# Patient Record
Sex: Male | Born: 1970 | Race: Black or African American | Hispanic: No | Smoking: Never smoker
Health system: Southern US, Community
[De-identification: ages and names within clinical notes are randomized; demographics above are authoritative.]

## PROBLEM LIST (undated history)

## (undated) HISTORY — PX: OTHER SURGICAL HISTORY: SHX169

---

## 1998-08-02 ENCOUNTER — Emergency Department (HOSPITAL_COMMUNITY): Admission: EM | Admit: 1998-08-02 | Discharge: 1998-08-02 | Payer: Self-pay | Admitting: Emergency Medicine

## 2005-02-24 ENCOUNTER — Emergency Department (HOSPITAL_COMMUNITY): Admission: EM | Admit: 2005-02-24 | Discharge: 2005-02-24 | Payer: Self-pay | Admitting: Family Medicine

## 2006-02-16 ENCOUNTER — Emergency Department (HOSPITAL_COMMUNITY): Admission: EM | Admit: 2006-02-16 | Discharge: 2006-02-16 | Payer: Self-pay | Admitting: Emergency Medicine

## 2007-04-26 ENCOUNTER — Encounter: Admission: RE | Admit: 2007-04-26 | Discharge: 2007-04-26 | Payer: Self-pay | Admitting: Occupational Medicine

## 2009-05-01 ENCOUNTER — Emergency Department (HOSPITAL_COMMUNITY): Admission: EM | Admit: 2009-05-01 | Discharge: 2009-05-01 | Payer: Self-pay | Admitting: Emergency Medicine

## 2011-03-07 LAB — HSV 2 ANTIBODY, IGG: HSV 2 Glycoprotein G Ab, IgG: 0.15 IV

## 2011-03-07 LAB — HSV 1 ANTIBODY, IGG: HSV 1 Glycoprotein G Ab, IgG: 47.5 IV — ABNORMAL HIGH

## 2013-12-01 ENCOUNTER — Encounter (HOSPITAL_COMMUNITY): Payer: Self-pay | Admitting: Emergency Medicine

## 2013-12-01 ENCOUNTER — Emergency Department (HOSPITAL_COMMUNITY)
Admission: EM | Admit: 2013-12-01 | Discharge: 2013-12-01 | Disposition: A | Discharge status: Home / Self Care | Payer: BC Managed Care – PPO | Attending: Emergency Medicine | Admitting: Emergency Medicine

## 2013-12-01 DIAGNOSIS — J069 Acute upper respiratory infection, unspecified: Secondary | ICD-10-CM | POA: Insufficient documentation

## 2013-12-01 DIAGNOSIS — IMO0001 Reserved for inherently not codable concepts without codable children: Secondary | ICD-10-CM | POA: Insufficient documentation

## 2013-12-01 DIAGNOSIS — R52 Pain, unspecified: Secondary | ICD-10-CM | POA: Insufficient documentation

## 2013-12-01 MED ORDER — HYDROCOD POLST-CHLORPHEN POLST 10-8 MG/5ML PO LQCR: 5.0000 mL | Freq: Every evening | ORAL | Status: DC | PRN

## 2013-12-01 MED ORDER — ALBUTEROL SULFATE HFA 108 (90 BASE) MCG/ACT IN AERS: 1.0000 | INHALATION_SPRAY | Freq: Four times a day (QID) | RESPIRATORY_TRACT | Status: DC | PRN

## 2013-12-01 NOTE — ED Notes (Signed)
Pt states he has had cough, sore throat and runny nose for past 3 days. NO n/v/d.

## 2013-12-01 NOTE — ED Provider Notes (Signed)
CSN: [id number]     Arrival date & time 12/01/13  1442 History  This chart was scribed for non-physician practitioner, Elpidio AnisShari Lorrain Rivers, PA-C working with Rolland PorterMark James, MD by Greggory StallionKayla Andersen, ED scribe. This patient was seen in room WTR7/WTR7 and the patient's care was started at 4:24 PM.   Chief Complaint  Patient presents with  . Sore Throat  . Cough   The history is provided by the patient. No language interpreter was used.   HPI Comments: Judith PartGregory T Drudge is a 43 y.o. male who presents to the Emergency Department complaining of subjective fever, generalized body aches, productive cough, sore throat and rhinorrhea that started 5 days ago. He has taken a sinus decongestant with little relief. Denies history of asthma. Denies history of smoking cigarettes.   History reviewed. No pertinent past medical history. History reviewed. No pertinent past surgical history. History reviewed. No pertinent family history. History  Substance Use Topics  . Smoking status: Never Smoker   . Smokeless tobacco: Not on file  . Alcohol Use: No    Review of Systems  Constitutional: Positive for fever.  HENT: Positive for rhinorrhea and sore throat.   Respiratory: Positive for cough.   Musculoskeletal: Positive for myalgias.  All other systems reviewed and are negative.    Allergies  Review of patient's allergies indicates no known allergies.  Home Medications   Current Outpatient Rx  Name  Route  Sig  Dispense  Refill  . OVER THE COUNTER MEDICATION   Oral   Take 2 tablets by mouth every 6 (six) hours as needed (cold symptoms). Over the counter sinus decongestion and pain          BP 121/86  Pulse 75  Temp(Src) 98.6 F (37 C) (Oral)  Resp 20  SpO2 98%  Physical Exam  Nursing note and vitals reviewed. Constitutional: He is oriented to person, place, and time. He appears well-developed and well-nourished. No distress.  HENT:  Head: Normocephalic and atraumatic.  Right Ear: Tympanic membrane  and ear canal normal.  Left Ear: Tympanic membrane and ear canal normal.  Nose: No mucosal edema.  Mouth/Throat: Posterior oropharyngeal erythema present. No oropharyngeal exudate.  Eyes: EOM are normal.  Neck: Neck supple. No tracheal deviation present.  Cardiovascular: Normal rate, regular rhythm and normal heart sounds.   Pulmonary/Chest: Effort normal and breath sounds normal. No respiratory distress. He has no wheezes. He has no rhonchi. He has no rales.  Musculoskeletal: Normal range of motion.  Neurological: He is alert and oriented to person, place, and time.  Skin: Skin is warm and dry.  Psychiatric: He has a normal mood and affect. His behavior is normal.    ED Course  Procedures (including critical care time)  DIAGNOSTIC STUDIES: Oxygen Saturation is 98% on RA, normal by my interpretation.    COORDINATION OF CARE: 4:26 PM-Discussed treatment plan which includes cough medication with pt at bedside and pt agreed to plan.   Labs Review Labs Reviewed - No data to display Imaging Review No results found.  EKG Interpretation   None       MDM  No diagnosis found. 1. URI  Well appearing patient with URI symptoms. Symptomatic treatment.   I personally performed the services described in this documentation, which was scribed in my presence. The recorded information has been reviewed and is accurate.    Arnoldo HookerShari A Bach Rocchi, PA-C 12/01/13 1639

## 2013-12-01 NOTE — Discharge Instructions (Signed)
Antibiotic Nonuse  Your caregiver felt that the infection or problem was not one that would be helped with an antibiotic. Infections may be caused by viruses or bacteria. Only a caregiver can tell which one of these is the likely cause of an illness. A cold is the most common cause of infection in both adults and children. A cold is a virus. Antibiotic treatment will have no effect on a viral infection. Viruses can lead to many lost days of work caring for sick children and many missed days of school. Children may catch as many as 10 "colds" or "flus" per year during which they can be tearful, cranky, and uncomfortable. The goal of treating a virus is aimed at keeping the ill person comfortable. Antibiotics are medications used to help the body fight bacterial infections. There are relatively few types of bacteria that cause infections but there are hundreds of viruses. While both viruses and bacteria cause infection they are very different types of germs. A viral infection will typically go away by itself within 7 to 10 days. Bacterial infections may spread or get worse without antibiotic treatment. Examples of bacterial infections are:  Sore throats (like strep throat or tonsillitis).  Infection in the lung (pneumonia).  Ear and skin infections. Examples of viral infections are:  Colds or flus.  Most coughs and bronchitis.  Sore throats not caused by Strep.  Runny noses. It is often best not to take an antibiotic when a viral infection is the cause of the problem. Antibiotics can kill off the helpful bacteria that we have inside our body and allow harmful bacteria to start growing. Antibiotics can cause side effects such as allergies, nausea, and diarrhea without helping to improve the symptoms of the viral infection. Additionally, repeated uses of antibiotics can cause bacteria inside of our body to become resistant. That resistance can be passed onto harmful bacterial. The next time you have  an infection it may be harder to treat if antibiotics are used when they are not needed. Not treating with antibiotics allows our own immune system to develop and take care of infections more efficiently. Also, antibiotics will work better for [place] when they are prescribed for bacterial infections. Treatments for a child that is ill may include:  Give extra fluids throughout the day to stay hydrated.  Get plenty of rest.  Only give your child over-the-counter or prescription medicines for pain, discomfort, or fever as directed by your caregiver.  The use of a cool mist humidifier may help stuffy noses.  Cold medications if suggested by your caregiver. Your caregiver may decide to start you on an antibiotic if:  The problem you were seen for today continues for a longer length of time than expected.  You develop a secondary bacterial infection. SEEK MEDICAL CARE IF:  Fever lasts longer than 5 days.  Symptoms continue to get worse after 5 to 7 days or become severe.  Difficulty in breathing develops.  Signs of dehydration develop (poor drinking, rare urinating, dark colored urine).  Changes in behavior or worsening tiredness (listlessness or lethargy). Document Released: 01/23/2002 Document Revised: 02/06/2012 Document Reviewed: 07/22/2009 [facility] Patient Information 2014 [place], [place].  Cough, Adult  A cough is a reflex that helps clear your throat and airways. It can help heal the body or may be a reaction to an irritated airway. A cough may only last 2 or 3 weeks (acute) or may last more than 8 weeks (chronic).  CAUSES Acute cough:  Viral or  bacterial infections. °Chronic cough: °· Infections. °· Allergies. °· Asthma. °· Post-nasal drip. °· Smoking. °· Heartburn or acid reflux. °· Some medicines. °· Chronic lung problems (COPD). °· Cancer. °SYMPTOMS  °· Cough. °· Fever. °· Chest pain. °· Increased breathing rate. °· High-pitched whistling sound when breathing  (wheezing). °· Colored mucus that you cough up (sputum). °TREATMENT  °· A bacterial cough may be treated with antibiotic medicine. °· A viral cough must run its course and will not respond to antibiotics. °· Your caregiver may recommend other treatments if you have a chronic cough. °HOME CARE INSTRUCTIONS  °· Only take over-the-counter or prescription medicines for pain, discomfort, or fever as directed by your caregiver. Use cough suppressants only as directed by your caregiver. °· Use a cold steam vaporizer or humidifier in your bedroom or home to help loosen secretions. °· Sleep in a semi-upright position if your cough is worse at night. °· Rest as needed. °· Stop smoking if you smoke. °SEEK IMMEDIATE MEDICAL CARE IF:  °· You have pus in your sputum. °· Your cough starts to worsen. °· You cannot control your cough with suppressants and are losing sleep. °· You begin coughing up blood. °· You have difficulty breathing. °· You develop pain which is getting worse or is uncontrolled with medicine. °· You have a fever. °MAKE SURE YOU:  °· Understand these instructions. °· Will watch your condition. °· Will get help right away if you are not doing well or get worse. °Document Released: 05/13/2011 Document Revised: 02/06/2012 Document Reviewed: 05/13/2011 °Exit[employer]® Patient Information ©2014 [employer]. °Upper Respiratory Infection, Adult °An upper respiratory infection (URI) is also known as the common cold. It is often caused by a type of germ (virus). Colds are easily spread (contagious). You can pass it to others by kissing, coughing, sneezing, or drinking out of the same glass. Usually, you get better in 1 or 2 weeks.  °HOME CARE  °· Only take medicine as told by your doctor. °· Use a warm mist humidifier or breathe in steam from a hot shower. °· Drink enough water and fluids to keep your pee (urine) clear or pale yellow. °· Get plenty of rest. °· Return to work when your temperature is back to normal or as told  by your doctor. You may use a face mask and wash your hands to stop your cold from spreading. °GET HELP RIGHT AWAY IF:  °· After the first few days, you feel you are getting worse. °· You have questions about your medicine. °· You have chills, shortness of breath, or brown or red spit (mucus). °· You have yellow or brown snot (nasal discharge) or pain in the face, especially when you bend forward. °· You have a fever, puffy (swollen) neck, pain when you swallow, or white spots in the back of your throat. °· You have a bad headache, ear pain, sinus pain, or chest pain. °· You have a high-pitched whistling sound when you breathe in and out (wheezing). °· You have a lasting cough or cough up blood. °· You have sore muscles or a stiff neck. °MAKE SURE YOU:  °· Understand these instructions. °· Will watch your condition. °· Will get help right away if you are not doing well or get worse. °Document Released: 05/02/2008 Document Revised: 02/06/2012 Document Reviewed: 03/21/2011 °ExitCare® Patient Information ©2014 [employer]. ° °

## 2013-12-11 NOTE — ED Provider Notes (Signed)
Medical screening examination/treatment/procedure(s) were performed by non-physician practitioner and as supervising physician I was immediately available for consultation/collaboration.  EKG Interpretation   None         Zan Triska, MD 12/11/13 0015 

## 2014-02-06 ENCOUNTER — Other Ambulatory Visit: Payer: Self-pay | Admitting: Orthopaedic Surgery

## 2014-02-06 DIAGNOSIS — S29011A Strain of muscle and tendon of front wall of thorax, initial encounter: Secondary | ICD-10-CM

## 2014-02-15 ENCOUNTER — Ambulatory Visit
Admission: RE | Admit: 2014-02-15 | Discharge: 2014-02-15 | Disposition: A | Discharge status: Home / Self Care | Payer: BC Managed Care – PPO | Source: Ambulatory Visit | Attending: Orthopaedic Surgery | Admitting: Orthopaedic Surgery

## 2014-02-15 DIAGNOSIS — S29011A Strain of muscle and tendon of front wall of thorax, initial encounter: Secondary | ICD-10-CM

## 2014-04-15 ENCOUNTER — Ambulatory Visit: Payer: BC Managed Care – PPO | Attending: Orthopaedic Surgery

## 2014-04-15 DIAGNOSIS — IMO0001 Reserved for inherently not codable concepts without codable children: Secondary | ICD-10-CM | POA: Insufficient documentation

## 2014-04-15 DIAGNOSIS — M25519 Pain in unspecified shoulder: Secondary | ICD-10-CM | POA: Insufficient documentation

## 2014-04-30 ENCOUNTER — Ambulatory Visit: Payer: BC Managed Care – PPO | Attending: Orthopaedic Surgery | Admitting: Rehabilitation

## 2014-04-30 DIAGNOSIS — IMO0001 Reserved for inherently not codable concepts without codable children: Secondary | ICD-10-CM | POA: Insufficient documentation

## 2014-04-30 DIAGNOSIS — M25519 Pain in unspecified shoulder: Secondary | ICD-10-CM | POA: Insufficient documentation

## 2014-05-01 ENCOUNTER — Ambulatory Visit: Payer: BC Managed Care – PPO | Admitting: Physical Therapy

## 2014-05-01 DIAGNOSIS — IMO0001 Reserved for inherently not codable concepts without codable children: Secondary | ICD-10-CM | POA: Diagnosis not present

## 2014-05-07 ENCOUNTER — Ambulatory Visit: Payer: BC Managed Care – PPO | Admitting: Rehabilitation

## 2014-05-07 DIAGNOSIS — IMO0001 Reserved for inherently not codable concepts without codable children: Secondary | ICD-10-CM | POA: Diagnosis not present

## 2014-05-08 ENCOUNTER — Ambulatory Visit: Payer: BC Managed Care – PPO | Admitting: Rehabilitation

## 2014-05-08 DIAGNOSIS — IMO0001 Reserved for inherently not codable concepts without codable children: Secondary | ICD-10-CM | POA: Diagnosis not present

## 2014-05-14 ENCOUNTER — Ambulatory Visit: Payer: BC Managed Care – PPO | Admitting: Rehabilitation

## 2014-05-14 DIAGNOSIS — IMO0001 Reserved for inherently not codable concepts without codable children: Secondary | ICD-10-CM | POA: Diagnosis not present

## 2014-05-21 ENCOUNTER — Ambulatory Visit: Payer: BC Managed Care – PPO | Admitting: Rehabilitation

## 2014-05-21 DIAGNOSIS — IMO0001 Reserved for inherently not codable concepts without codable children: Secondary | ICD-10-CM | POA: Diagnosis not present

## 2014-05-22 ENCOUNTER — Ambulatory Visit: Payer: BC Managed Care – PPO | Admitting: Physical Therapy

## 2014-05-22 DIAGNOSIS — IMO0001 Reserved for inherently not codable concepts without codable children: Secondary | ICD-10-CM | POA: Diagnosis not present

## 2014-05-28 ENCOUNTER — Encounter: Payer: BC Managed Care – PPO | Admitting: Rehabilitation

## 2014-05-29 ENCOUNTER — Encounter: Payer: BC Managed Care – PPO | Admitting: Rehabilitation

## 2014-06-04 ENCOUNTER — Ambulatory Visit: Payer: BC Managed Care – PPO | Attending: Orthopaedic Surgery | Admitting: Rehabilitation

## 2014-06-04 DIAGNOSIS — IMO0001 Reserved for inherently not codable concepts without codable children: Secondary | ICD-10-CM | POA: Insufficient documentation

## 2014-06-04 DIAGNOSIS — M25519 Pain in unspecified shoulder: Secondary | ICD-10-CM | POA: Insufficient documentation

## 2014-06-05 ENCOUNTER — Ambulatory Visit: Payer: BC Managed Care – PPO | Admitting: Physical Therapy

## 2014-06-05 DIAGNOSIS — IMO0001 Reserved for inherently not codable concepts without codable children: Secondary | ICD-10-CM | POA: Diagnosis present

## 2014-06-05 DIAGNOSIS — M25519 Pain in unspecified shoulder: Secondary | ICD-10-CM | POA: Diagnosis not present

## 2014-06-11 ENCOUNTER — Ambulatory Visit: Payer: BC Managed Care – PPO | Admitting: Rehabilitation

## 2014-06-17 ENCOUNTER — Ambulatory Visit: Payer: BC Managed Care – PPO | Admitting: Rehabilitation

## 2014-06-17 DIAGNOSIS — IMO0001 Reserved for inherently not codable concepts without codable children: Secondary | ICD-10-CM | POA: Diagnosis not present

## 2014-06-25 ENCOUNTER — Encounter: Payer: BC Managed Care – PPO | Admitting: Rehabilitation

## 2014-07-02 ENCOUNTER — Ambulatory Visit: Payer: BC Managed Care – PPO | Attending: Orthopaedic Surgery | Admitting: Rehabilitation

## 2014-07-02 DIAGNOSIS — IMO0001 Reserved for inherently not codable concepts without codable children: Secondary | ICD-10-CM | POA: Diagnosis present

## 2014-07-02 DIAGNOSIS — M25519 Pain in unspecified shoulder: Secondary | ICD-10-CM | POA: Diagnosis not present

## 2014-07-09 ENCOUNTER — Ambulatory Visit: Payer: BC Managed Care – PPO | Admitting: Rehabilitation

## 2014-07-09 DIAGNOSIS — IMO0001 Reserved for inherently not codable concepts without codable children: Secondary | ICD-10-CM | POA: Diagnosis not present

## 2014-07-18 ENCOUNTER — Encounter (HOSPITAL_COMMUNITY): Payer: Self-pay | Admitting: Emergency Medicine

## 2014-07-18 ENCOUNTER — Emergency Department (HOSPITAL_COMMUNITY)
Admission: EM | Admit: 2014-07-18 | Discharge: 2014-07-18 | Disposition: A | Discharge status: Home / Self Care | Payer: Worker's Compensation | Attending: Emergency Medicine | Admitting: Emergency Medicine

## 2014-07-18 ENCOUNTER — Emergency Department (HOSPITAL_COMMUNITY): Payer: Worker's Compensation

## 2014-07-18 DIAGNOSIS — Y9289 Other specified places as the place of occurrence of the external cause: Secondary | ICD-10-CM | POA: Diagnosis not present

## 2014-07-18 DIAGNOSIS — IMO0002 Reserved for concepts with insufficient information to code with codable children: Secondary | ICD-10-CM | POA: Diagnosis not present

## 2014-07-18 DIAGNOSIS — Z77098 Contact with and (suspected) exposure to other hazardous, chiefly nonmedicinal, chemicals: Secondary | ICD-10-CM

## 2014-07-18 DIAGNOSIS — Y9389 Activity, other specified: Secondary | ICD-10-CM | POA: Diagnosis not present

## 2014-07-18 DIAGNOSIS — Y990 Civilian activity done for income or pay: Secondary | ICD-10-CM

## 2014-07-18 DIAGNOSIS — S20219A Contusion of unspecified front wall of thorax, initial encounter: Secondary | ICD-10-CM | POA: Diagnosis not present

## 2014-07-18 DIAGNOSIS — S0180XA Unspecified open wound of other part of head, initial encounter: Secondary | ICD-10-CM | POA: Insufficient documentation

## 2014-07-18 DIAGNOSIS — S0181XA Laceration without foreign body of other part of head, initial encounter: Secondary | ICD-10-CM

## 2014-07-18 NOTE — ED Provider Notes (Signed)
CSN: [id number]     Arrival date & time 07/18/14  0904 History  This chart was scribed for non-physician practitioner Arthor CaptainAbigail Alton Tremblay, PA-C working with Samuel JesterKathleen McManus, DO by Leone PayorSonum Patel, ED Scribe. This patient was seen in room TR10C/TR10C and the patient's care was started at 9:54 AM.    No chief complaint on file.   The history is provided by the patient. No language interpreter was used.    HPI Comments: Bobby Love is a 43 y.o. male who presents to the Emergency Department complaining of a chin laceration that occurred 2 hours ago. Patient works in a [employer] plant; while at work, the lid of a pressurized container became detached and struck patient to the upper chest and then to the chin. He complains of bruising to the upper chest and a laceration to the chin. He denies tongue biting, oral trauma, LOC, significant dental pain, SOB, abdominal pain.  History reviewed. No pertinent past medical history. Past Surgical History  Procedure Laterality Date  . Pectoris tear     No family history on file. History  Substance Use Topics  . Smoking status: Never Smoker   . Smokeless tobacco: Not on file  . Alcohol Use: No    Review of Systems  HENT: Positive for dental problem.   Respiratory: Negative for shortness of breath.   Cardiovascular: Positive for chest pain.  Gastrointestinal: Negative for abdominal pain.  Skin: Positive for wound.      Allergies  Review of patient's allergies indicates no known allergies.  Home Medications   Prior to Admission medications   Medication Sig Start Date End Date Taking? Authorizing Provider  albuterol (PROVENTIL HFA;VENTOLIN HFA) 108 (90 BASE) MCG/ACT inhaler Inhale 1-2 puffs into the lungs every 6 (six) hours as needed for wheezing or shortness of breath. 12/01/13   Shari A Upstill, PA-C  chlorpheniramine-HYDROcodone (TUSSIONEX PENNKINETIC ER) 10-8 MG/5ML LQCR Take 5 mLs by mouth at bedtime as needed for cough. 12/01/13   Shari A  Upstill, PA-C  OVER THE COUNTER MEDICATION Take 2 tablets by mouth every 6 (six) hours as needed (cold symptoms). Over the counter sinus decongestion and pain    Historical Provider, MD   BP 146/86  Pulse 67  Temp(Src) 98.1 F (36.7 C) (Oral)  Resp 18  SpO2 100% Physical Exam  Nursing note and vitals reviewed. Constitutional: He is oriented to person, place, and time. He appears well-developed and well-nourished.  HENT:  Head: Normocephalic.  Dentition intact. Superficial, 5-6 cm laceration with ragged edges. No visible bone   Cardiovascular: Normal rate.   Pulmonary/Chest: Effort normal.  Bruising over bilateral sternoclavicular joint. Abrasion on the lateral right pec wall.   Abdominal: He exhibits no distension.  Neurological: He is alert and oriented to person, place, and time. GCS eye subscore is 4. GCS verbal subscore is 5. GCS motor subscore is 6.  NVI  Skin: Skin is warm and dry.  Psychiatric: He has a normal mood and affect.    ED Course  Procedures (including critical care time)  DIAGNOSTIC STUDIES: Oxygen Saturation is 100% on RA, norma by my interpretation.    COORDINATION OF CARE: 9:54 AM Discussed treatment plan with pt at bedside and pt agreed to plan.  11:22 AM LACERATION REPAIR Performed by: Arthor CaptainAbigail Ximenna Fonseca, PA-C  Consent: Verbal consent obtained. Risks and benefits: risks, benefits and alternatives were discussed Patient identity confirmed: provided demographic data Time out performed prior to procedure Prepped and Draped in normal sterile fashion Wound explored  Laceration Location: submental region, ragged edges Laceration Length: 6 cm No Foreign Bodies seen or palpated Anesthesia: local infiltration Local anesthetic: lidocaine 2% with epinephrine Anesthetic total: 8 ml Irrigation method: syringe Amount of cleaning: standard Skin closure: 5-0 prolene  Number of sutures or staples: 9 Technique: SI, HM, half buried  Patient tolerance: Patient  tolerated the procedure well with no immediate complications. Complex margin alignment required advanced suturing technique and greater than average time for repair.    Labs Review Labs Reviewed - No data to display  Imaging Review No results found.   EKG Interpretation None      MDM   Final diagnoses:  Chin laceration, initial encounter  Hematoma, chest wall, unspecified laterality, initial encounter  Work related injury  Occupational exposure to chemicals    Patient with exposure to chemicals.  some caustic agents,  Non-toxic. Patient has no complaints of burning or pain. Tdap utd for clean wound.Pressure irrigation performed. Laceration occurred < 8 hours prior to repair which was well tolerated. Pt has no co morbidities to effect normal wound healing. Discussed suture home care w pt and answered questions. Pt to f-u for wound check and suture removal in 7 days. Pt is hemodynamically stable w no complaints prior to dc.     I personally performed the services described in this documentation, which was scribed in my presence. The recorded information has been reviewed and is accurate.    Arthor Captain, PA-C 07/18/14 1132

## 2014-07-18 NOTE — ED Notes (Addendum)
While at work,the lid of  a pressurized container blew off, striking pt in chin. Pt has a 3 cm laceration to lower chin. Bleeding controlled. Tetanus was 7 years ago as reported by pt. Worker's [employer] representative at bedside. Is contacting place of business to get information on the dye involved. Pt also has red, swollen area on upper chest from impact of lid. Denies SOB. Soiled T-shirt removed. Chest, head and face wiped down with washcloth.

## 2014-07-18 NOTE — ED Notes (Signed)
PA At bedside

## 2014-07-19 NOTE — ED Provider Notes (Signed)
Medical screening examination/treatment/procedure(s) were performed by non-physician practitioner and as supervising physician I was immediately available for consultation/collaboration.   EKG Interpretation None        Samuel JesterKathleen Zailynn Brandel, DO 07/19/14 1620
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
## 2017-06-08 ENCOUNTER — Telehealth (INDEPENDENT_AMBULATORY_CARE_PROVIDER_SITE_OTHER): Payer: Self-pay

## 2017-06-08 NOTE — Telephone Encounter (Signed)
See message below °

## 2017-06-08 NOTE — Telephone Encounter (Signed)
no

## 2017-06-08 NOTE — Telephone Encounter (Signed)
Bobby HongJudy with Dr. [facility] office would like to know if patient needs to be pre-medicated before having dental work done?CB# is [phone] or she stated that you can fax her your answer over at [phone].  Thank You.

## 2017-06-08 NOTE — Telephone Encounter (Signed)
Faxed to [phone]

## 2017-07-07 ENCOUNTER — Other Ambulatory Visit: Payer: Self-pay | Admitting: Occupational Medicine

## 2017-07-07 ENCOUNTER — Ambulatory Visit: Payer: Self-pay

## 2017-07-07 DIAGNOSIS — S9781XA Crushing injury of right foot, initial encounter: Secondary | ICD-10-CM
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
## 2019-06-14 DIAGNOSIS — Z20828 Contact with and (suspected) exposure to other viral communicable diseases: Secondary | ICD-10-CM | POA: Diagnosis not present

## 2019-06-21 DIAGNOSIS — Z86010 Personal history of colonic polyps: Secondary | ICD-10-CM | POA: Diagnosis not present

## 2019-06-21 DIAGNOSIS — Z800 Family history of malignant neoplasm of digestive organs: Secondary | ICD-10-CM | POA: Diagnosis not present

## 2019-06-21 DIAGNOSIS — D122 Benign neoplasm of ascending colon: Secondary | ICD-10-CM | POA: Diagnosis not present

## 2019-08-22 DIAGNOSIS — Z23 Encounter for immunization: Secondary | ICD-10-CM | POA: Diagnosis not present

## 2019-08-22 DIAGNOSIS — Z13220 Encounter for screening for lipoid disorders: Secondary | ICD-10-CM | POA: Diagnosis not present

## 2019-08-22 DIAGNOSIS — Z0000 Encounter for general adult medical examination without abnormal findings: Secondary | ICD-10-CM | POA: Diagnosis not present

## 2019-08-22 DIAGNOSIS — R5383 Other fatigue: Secondary | ICD-10-CM | POA: Diagnosis not present

## 2019-08-22 DIAGNOSIS — Z125 Encounter for screening for malignant neoplasm of prostate: Secondary | ICD-10-CM | POA: Diagnosis not present

## 2019-09-17 DIAGNOSIS — Z20828 Contact with and (suspected) exposure to other viral communicable diseases: Secondary | ICD-10-CM | POA: Diagnosis not present

## 2019-09-25 DIAGNOSIS — N289 Disorder of kidney and ureter, unspecified: Secondary | ICD-10-CM | POA: Diagnosis not present

## 2020-02-08 ENCOUNTER — Ambulatory Visit: Payer: Self-pay

## 2023-06-18 ENCOUNTER — Encounter (HOSPITAL_COMMUNITY): Payer: Self-pay

## 2023-06-18 ENCOUNTER — Ambulatory Visit (INDEPENDENT_AMBULATORY_CARE_PROVIDER_SITE_OTHER): Payer: BC Managed Care – PPO

## 2023-06-18 ENCOUNTER — Ambulatory Visit (HOSPITAL_COMMUNITY)
Admission: EM | Admit: 2023-06-18 | Discharge: 2023-06-18 | Disposition: A | Discharge status: Home / Self Care | Payer: BC Managed Care – PPO | Attending: Internal Medicine | Admitting: Internal Medicine

## 2023-06-18 DIAGNOSIS — Z23 Encounter for immunization: Secondary | ICD-10-CM | POA: Diagnosis not present

## 2023-06-18 DIAGNOSIS — S61512A Laceration without foreign body of left wrist, initial encounter: Secondary | ICD-10-CM | POA: Diagnosis not present

## 2023-06-18 DIAGNOSIS — R030 Elevated blood-pressure reading, without diagnosis of hypertension: Secondary | ICD-10-CM

## 2023-06-18 MED ORDER — KETOROLAC TROMETHAMINE 30 MG/ML IJ SOLN
INTRAMUSCULAR | Status: AC
  Filled 2023-06-18: qty 1

## 2023-06-18 MED ORDER — TETANUS-DIPHTH-ACELL PERTUSSIS 5-2.5-18.5 LF-MCG/0.5 IM SUSY
PREFILLED_SYRINGE | INTRAMUSCULAR | Status: AC
  Filled 2023-06-18: qty 0.5

## 2023-06-18 MED ORDER — LIDOCAINE-EPINEPHRINE 1 %-1:100000 IJ SOLN
INTRAMUSCULAR | Status: AC
  Filled 2023-06-18: qty 1

## 2023-06-18 MED ORDER — ACETAMINOPHEN 325 MG PO TABS
ORAL_TABLET | ORAL | Status: AC
  Filled 2023-06-18: qty 3

## 2023-06-18 MED ORDER — TETANUS-DIPHTH-ACELL PERTUSSIS 5-2.5-18.5 LF-MCG/0.5 IM SUSY
0.5000 mL | PREFILLED_SYRINGE | Freq: Once | INTRAMUSCULAR | Status: AC
  Administered 2023-06-18: 0.5 mL via INTRAMUSCULAR

## 2023-06-18 MED ORDER — KETOROLAC TROMETHAMINE 30 MG/ML IJ SOLN
30.0000 mg | Freq: Once | INTRAMUSCULAR | Status: AC
  Administered 2023-06-18: 30 mg via INTRAMUSCULAR

## 2023-06-18 MED ORDER — ACETAMINOPHEN 325 MG PO TABS
975.0000 mg | ORAL_TABLET | Freq: Once | ORAL | Status: AC
  Administered 2023-06-18: 975 mg via ORAL

## 2023-06-18 NOTE — ED Provider Notes (Addendum)
[facility]    CSN: [id number] Arrival date & time: 06/18/23  1451      History   Chief Complaint Chief Complaint  Patient presents with   Laceration    HPI Bobby Love is a 52 y.o. male.   Patient presents to urgent care for evaluation of laceration to the ventral aspect of the left wrist that happened approximately 1 hour ago.  Patient was removing a mirror from a dresser when the mirror slipped out of his hand and fell onto his outstretched left wrist.  The glass from the mirror did not break.  Wound bled significantly initially, bleeding controlled with pressure.  He does not use blood thinning medications.  Full range of motion and full sensation distally to injury.  Unsure of date of last tetanus injection.     History reviewed. No pertinent past medical history.  There are no problems to display for this patient.   Past Surgical History:  Procedure Laterality Date   Pectoris tear         Home Medications    Prior to Admission medications   Medication Sig Start Date End Date Taking? Authorizing Provider  albuterol (PROVENTIL HFA;VENTOLIN HFA) 108 (90 BASE) MCG/ACT inhaler Inhale 1-2 puffs into the lungs every 6 (six) hours as needed for wheezing or shortness of breath. 12/01/13   Elpidio Anis, PA-C  chlorpheniramine-HYDROcodone (TUSSIONEX PENNKINETIC ER) 10-8 MG/5ML LQCR Take 5 mLs by mouth at bedtime as needed for cough. 12/01/13   Elpidio Anis, PA-C  OVER THE COUNTER MEDICATION Take 2 tablets by mouth every 6 (six) hours as needed (cold symptoms). Over the counter sinus decongestion and pain    [provider]    Family History History reviewed. No pertinent family history.  Social History Social History   Tobacco Use   Smoking status: Never  Substance Use Topics   Alcohol use: No   Drug use: No     Allergies   Patient has no known allergies.   Review of Systems Review of Systems Per HPI  Physical Exam Triage  Vital Signs ED Triage Vitals [06/18/23 1525]  Encounter Vitals Group     BP (!) 180/105     Systolic BP Percentile      Diastolic BP Percentile      Pulse Rate 65     Resp 16     Temp 98.1 F (36.7 C)     Temp Source Oral     SpO2      Weight      Height      Head Circumference      Peak Flow      Pain Score      Pain Loc      Pain Education      Exclude from Growth Chart    No data found.  Updated Vital Signs BP (!) 180/105 (BP Location: Left Arm)   Pulse 65   Temp 98.1 F (36.7 C) (Oral)   Resp 16   Visual Acuity Right Eye Distance:   Left Eye Distance:   Bilateral Distance:    Right Eye Near:   Left Eye Near:    Bilateral Near:     Physical Exam Vitals and nursing note reviewed.  Constitutional:      Appearance: He is not ill-appearing or toxic-appearing.  HENT:     Head: Normocephalic and atraumatic.     Right Ear: Hearing and external ear normal.     Left  Ear: Hearing and external ear normal.     Nose: Nose normal.     Mouth/Throat:     Lips: Pink.  Eyes:     General: Lids are normal. Vision grossly intact. Gaze aligned appropriately.     Extraocular Movements: Extraocular movements intact.     Conjunctiva/sclera: Conjunctivae normal.  Pulmonary:     Effort: Pulmonary effort is normal.  Musculoskeletal:     Cervical back: Neck supple.  Skin:    General: Skin is warm and dry.     Capillary Refill: Capillary refill takes less than 2 seconds.     Findings: Laceration present. No rash.     Comments: 6 cm laceration present to the ventral aspect of the left wrist.  +2 left radial and ulnar pulses present.  Adequate distal perfusion with less than 2 capillary refill to the left hand.  Full range of motion of the left wrist.  No obvious foreign bodies visualized with full range of motion, entire depth of wound visualized. 5/5 grip strength bilaterally.   Neurological:     General: No focal deficit present.     Mental Status: He is alert and oriented to  person, place, and time. Mental status is at baseline.     Cranial Nerves: No dysarthria or facial asymmetry.  Psychiatric:        Mood and Affect: Mood normal.        Speech: Speech normal.        Behavior: Behavior normal.        Thought Content: Thought content normal.        Judgment: Judgment normal.   Post-repair   Prior to laceration repair    UC Treatments / Results  Labs (all labs ordered are listed, but only abnormal results are displayed) Labs Reviewed - No data to display  EKG   Radiology DG Wrist Complete Left  Result Date: 06/18/2023 CLINICAL DATA:  Laceration. EXAM: LEFT WRIST - COMPLETE 3+ VIEW COMPARISON:  None Available. FINDINGS: There is a soft tissue defect overlying the lateral aspect of the distal radius. No underlying fracture or dislocation. No radiopaque foreign bodies identified. No significant arthropathy. IMPRESSION: Soft tissue defect overlying the lateral aspect of the distal radius compatible with laceration. No underlying fracture or radiopaque foreign body. Electronically Signed   By: Signa Kell M.D.   On: 06/18/2023 16:33    Procedures Laceration Repair  Date/Time: 06/18/2023 4:58 PM  Performed by: Carlisle Beers, FNP Authorized by: Carlisle Beers, FNP   Consent:    Consent obtained:  Verbal   Consent given by:  Patient   Risks, benefits, and alternatives were discussed: yes     Risks discussed:  Infection, need for additional repair, nerve damage, pain, poor cosmetic result, poor wound healing, retained foreign body, tendon damage and vascular damage   Alternatives discussed:  No treatment Universal protocol:    Procedure explained and questions answered to patient or proxy's satisfaction: yes     Patient identity confirmed:  Verbally with patient Anesthesia:    Anesthesia method:  Local infiltration   Local anesthetic:  Lidocaine 1% WITH epi Laceration details:    Location:  Shoulder/arm   Shoulder/arm location:   L lower arm   Length (cm):  6   Depth (mm):  7 Exploration:    Imaging outcome: foreign body not noted     Wound exploration: wound explored through full range of motion and entire depth of wound visualized  Contaminated: no   Treatment:    Area cleansed with:  Povidone-iodine, Shur-Clens and soap and water   Amount of cleaning:  Standard   Irrigation solution:  Sterile saline   Irrigation method:  Syringe Skin repair:    Repair method:  Sutures   Suture size:  4-0   Suture material:  Nylon   Suture technique:  Simple interrupted   Number of sutures:  6 Approximation:    Approximation:  Close Repair type:    Repair type:  Simple Post-procedure details:    Dressing:  Non-adherent dressing   Procedure completion:  Tolerated well, no immediate complications  (including critical care time)  Medications Ordered in UC Medications  ketorolac (TORADOL) 30 MG/ML injection 30 mg (30 mg Intramuscular Given 06/18/23 1537)  acetaminophen (TYLENOL) tablet 975 mg (975 mg Oral Given 06/18/23 1537)  Tdap (BOOSTRIX) injection 0.5 mL (0.5 mLs Intramuscular Given 06/18/23 1549)    Initial Impression / Assessment and Plan / UC Course  I have reviewed the triage vital signs and the nursing notes.  Pertinent labs & imaging results that were available during my care of the patient were reviewed by me and considered in my medical decision making (see chart for details).   1. Laceration of left wrist, initial encounter, need for tetanus booster Laceration repaired, see procedure note above for details. Discussed wound care and cleaning at home.  Imaging: unremarkable for bony abnormality. Infection return precautions discussed.  Suture removal in 10 days.  Tdap updated today.  Tylenol as needed for pain at home.  Advised to rest and avoid activities that may increase tension to wound/sutures or expose wound to infection. Excuse note given.   Blood pressure elevated in clinic without diagnosis  of hypertension.  Elevated blood pressure likely secondary to laceration of left wrist.  Advised to monitor elevated blood pressure at home and follow-up with PCP.  I spent 30 minutes of face-to-face and non-face-to-face time with patient.  This included previsit chart review, lab review, study review, order entry, electronic health record documentation, patient education.   Counseled patient on potential for adverse effects with medications prescribed/recommended today, strict ER and return-to-clinic precautions discussed, patient verbalized understanding.    Final Clinical Impressions(s) / UC Diagnoses   Final diagnoses:  Laceration of left wrist, initial encounter  Need for tetanus booster     Discharge Instructions      Wound care: Please keep the area surrounding the wound/sutures clean and dry for the next 24 hours. After 24 hours, you may get the wound wet. Gently clean wound with antibacterial soap. Do not scrub wound. Cover the area with a nonstick bandage and change the bandage 2 times a day.   You should have the sutures removed in 10 days by your primary care provider or at urgent care. Return sooner than 10 days if you experience discharge from your laceration, redness around your laceration, warmth around your laceration, or fever.   You may take over the counter medicines as needed for aches and pains once the numbing wears off.   Thanks for letting me fix your cut today!      ED Prescriptions   None    PDMP not reviewed this encounter.   Carlisle Beers, FNP 06/18/23 1702    Carlisle Beers, FNP 06/18/23 2055

## 2023-06-18 NOTE — ED Triage Notes (Signed)
Pt presents to the office for left wrist laceration. Pt stated he was moving a mirror and cut his wrist. Tetanus is needed.

## 2023-06-18 NOTE — Discharge Instructions (Addendum)

## 2023-06-29 ENCOUNTER — Ambulatory Visit (HOSPITAL_COMMUNITY)
Admission: EM | Admit: 2023-06-29 | Discharge: 2023-06-29 | Disposition: A | Discharge status: Home / Self Care | Payer: BC Managed Care – PPO | Attending: Emergency Medicine | Admitting: Emergency Medicine

## 2023-06-29 ENCOUNTER — Encounter (HOSPITAL_COMMUNITY): Payer: Self-pay

## 2023-06-29 DIAGNOSIS — Z4802 Encounter for removal of sutures: Secondary | ICD-10-CM

## 2023-06-29 DIAGNOSIS — M25532 Pain in left wrist: Secondary | ICD-10-CM | POA: Diagnosis not present

## 2023-06-29 DIAGNOSIS — G5612 Other lesions of median nerve, left upper limb: Secondary | ICD-10-CM | POA: Diagnosis not present

## 2023-06-29 MED ORDER — IBUPROFEN 800 MG PO TABS: 800.0000 mg | ORAL_TABLET | Freq: Three times a day (TID) | ORAL | 0 refills | Status: AC

## 2023-06-29 NOTE — ED Provider Notes (Signed)
[facility]    CSN: [id number] Arrival date & time: 06/29/23  1652     History   Chief Complaint Chief Complaint  Patient presents with   hand tingling   Suture / Staple Removal    HPI Bobby Love is a 52 y.o. male.  Here for suture removal Seen 06/18/2023 at this urgent care for laceration of the left ventral wrist. 6 sutures placed. Denies drainage, redness/warmth at area  Over the last several days has been having tingling sensation in the thumb index and middle finger of the left hand.  Worse with flexion of wrist No medications or interventions attempted   History reviewed. No pertinent past medical history.  There are no problems to display for this patient.   Past Surgical History:  Procedure Laterality Date   Pectoris tear         Home Medications    Prior to Admission medications   Medication Sig Start Date End Date Taking? Authorizing Provider  ibuprofen (ADVIL) 800 MG tablet Take 1 tablet (800 mg total) by mouth 3 (three) times daily. 06/29/23  Yes Nixon Kolton, Lurena Joiner, PA-C    Family History Family History  Problem Relation Age of Onset   Osteoarthritis Mother     Social History Social History   Tobacco Use   Smoking status: Never  Vaping Use   Vaping status: Never Used  Substance Use Topics   Alcohol use: No   Drug use: No     Allergies   Patient has no known allergies.   Review of Systems Review of Systems As per HPI  Physical Exam Triage Vital Signs ED Triage Vitals  Encounter Vitals Group     BP 06/29/23 1724 (!) 164/106     Systolic BP Percentile --      Diastolic BP Percentile --      Pulse Rate 06/29/23 1724 60     Resp 06/29/23 1724 16     Temp 06/29/23 1724 98.4 F (36.9 C)     Temp Source 06/29/23 1724 Oral     SpO2 06/29/23 1724 96 %     Weight --      Height --      Head Circumference --      Peak Flow --      Pain Score 06/29/23 1727 7     Pain Loc --      Pain Education --      Exclude from  Growth Chart --    No data found.  Updated Vital Signs BP (!) 164/106 (BP Location: Left Arm)   Pulse 60   Temp 98.4 F (36.9 C) (Oral)   Resp 16   SpO2 96%    Physical Exam Vitals and nursing note reviewed.  Constitutional:      General: He is not in acute distress. HENT:     Mouth/Throat:     Pharynx: Oropharynx is clear.  Cardiovascular:     Rate and Rhythm: Normal rate and regular rhythm.     Pulses: Normal pulses.  Pulmonary:     Effort: Pulmonary effort is normal.  Musculoskeletal:        General: No swelling. Normal range of motion.     Comments: Good ROM at wrist. Grip strength intact. Distal sensation intact all fingers. Cap refill < 2 seconds. +Phalen test   Skin:    General: Skin is warm and dry.     Capillary Refill: Capillary refill takes less than 2 seconds.  Findings: Laceration present.     Comments: Laceration left wrist healing well. 6 sutures visible. No drainage or erythema   Neurological:     Mental Status: He is alert and oriented to person, place, and time.     UC Treatments / Results  Labs (all labs ordered are listed, but only abnormal results are displayed) Labs Reviewed - No data to display  EKG  Radiology No results found.  Procedures Procedures (including critical care time)  Medications Ordered in UC Medications - No data to display  Initial Impression / Assessment and Plan / UC Course  I have reviewed the triage vital signs and the nursing notes.  Pertinent labs & imaging results that were available during my care of the patient were reviewed by me and considered in my medical decision making (see chart for details).  6 sutures removed by this provider Healing well Neurovascularly intact  Suspect inflammation and/or scar tissue pressing on median nerve given distribution of symptoms.  Lower concern about laceration damaging the nerve given his symptoms did not start immediately after the incident. Applied wrist brace in  clinic for support. Recommend using ibuprofen for inflammation.  Provided with orthopedic clinic for follow-up and further intervention if required.  Patient is agreeable to plan, all questions answered  Final Clinical Impressions(s) / UC Diagnoses   Final diagnoses:  Visit for suture removal  Left median nerve neuropathy     Discharge Instructions      Please wear the brace for support. This may help with your symptoms  I recommend ibuprofen for inflammation and swelling  Please follow with the hand/orthopedic specialists for further intervention      ED Prescriptions     Medication Sig Dispense Auth. Provider   ibuprofen (ADVIL) 800 MG tablet Take 1 tablet (800 mg total) by mouth 3 (three) times daily. 21 tablet Yajahira Tison, Lurena Joiner, PA-C      PDMP not reviewed this encounter.   Sameria Morss, Lurena Joiner, [place] 06/29/23 1478

## 2023-06-29 NOTE — Discharge Instructions (Addendum)
Please wear the brace for support. This may help with your symptoms  I recommend ibuprofen for inflammation and swelling  Please follow with the hand/orthopedic specialists for further intervention

## 2023-06-29 NOTE — ED Triage Notes (Signed)
Patient c/o tingling in the left thumb, pointer, and middle finger and states he can't sleep at night.  Patient has 6 sutures to the left inner wrist. No redness, but slight edema.

## 2023-06-30 DIAGNOSIS — M25532 Pain in left wrist: Secondary | ICD-10-CM | POA: Diagnosis not present

## 2023-06-30 DIAGNOSIS — G8918 Other acute postprocedural pain: Secondary | ICD-10-CM | POA: Diagnosis not present

## 2023-06-30 DIAGNOSIS — S6412XA Injury of median nerve at wrist and hand level of left arm, initial encounter: Secondary | ICD-10-CM | POA: Diagnosis not present

## 2023-07-07 DIAGNOSIS — S61512A Laceration without foreign body of left wrist, initial encounter: Secondary | ICD-10-CM | POA: Diagnosis not present

## 2023-07-07 DIAGNOSIS — S66822A Laceration of other specified muscles, fascia and tendons at wrist and hand level, left hand, initial encounter: Secondary | ICD-10-CM | POA: Diagnosis not present

## 2023-07-07 DIAGNOSIS — S6412XA Injury of median nerve at wrist and hand level of left arm, initial encounter: Secondary | ICD-10-CM | POA: Diagnosis not present

## 2023-07-07 DIAGNOSIS — S5432XA Injury of cutaneous sensory nerve at forearm level, left arm, initial encounter: Secondary | ICD-10-CM | POA: Diagnosis not present

## 2023-07-07 DIAGNOSIS — S6422XD Injury of radial nerve at wrist and hand level of left arm, subsequent encounter: Secondary | ICD-10-CM | POA: Diagnosis not present

## 2023-07-07 DIAGNOSIS — S56122A Laceration of flexor muscle, fascia and tendon of left index finger at forearm level, initial encounter: Secondary | ICD-10-CM | POA: Diagnosis not present

## 2023-07-07 DIAGNOSIS — Y999 Unspecified external cause status: Secondary | ICD-10-CM | POA: Diagnosis not present

## 2023-07-07 DIAGNOSIS — X58XXXA Exposure to other specified factors, initial encounter: Secondary | ICD-10-CM | POA: Diagnosis not present

## 2023-07-24 DIAGNOSIS — S6412XD Injury of median nerve at wrist and hand level of left arm, subsequent encounter: Secondary | ICD-10-CM | POA: Diagnosis not present

## 2023-07-29 ENCOUNTER — Emergency Department (HOSPITAL_COMMUNITY): Payer: BC Managed Care – PPO

## 2023-07-29 ENCOUNTER — Emergency Department (HOSPITAL_COMMUNITY)
Admission: EM | Admit: 2023-07-29 | Discharge: 2023-07-29 | Disposition: A | Discharge status: Home / Self Care | Payer: BC Managed Care – PPO | Attending: Emergency Medicine | Admitting: Emergency Medicine

## 2023-07-29 ENCOUNTER — Encounter (HOSPITAL_COMMUNITY): Payer: Self-pay | Admitting: Emergency Medicine

## 2023-07-29 ENCOUNTER — Other Ambulatory Visit: Payer: Self-pay

## 2023-07-29 DIAGNOSIS — M4316 Spondylolisthesis, lumbar region: Secondary | ICD-10-CM | POA: Diagnosis not present

## 2023-07-29 DIAGNOSIS — M542 Cervicalgia: Secondary | ICD-10-CM | POA: Diagnosis not present

## 2023-07-29 DIAGNOSIS — M5136 Other intervertebral disc degeneration, lumbar region: Secondary | ICD-10-CM | POA: Diagnosis not present

## 2023-07-29 DIAGNOSIS — M545 Low back pain, unspecified: Secondary | ICD-10-CM | POA: Insufficient documentation

## 2023-07-29 DIAGNOSIS — Y92410 Unspecified street and highway as the place of occurrence of the external cause: Secondary | ICD-10-CM | POA: Diagnosis not present

## 2023-07-29 MED ORDER — CYCLOBENZAPRINE HCL 10 MG PO TABS: 10.0000 mg | ORAL_TABLET | Freq: Two times a day (BID) | ORAL | 0 refills | Status: DC | PRN

## 2023-07-29 MED ORDER — CYCLOBENZAPRINE HCL 10 MG PO TABS: 10.0000 mg | ORAL_TABLET | Freq: Two times a day (BID) | ORAL | 0 refills | Status: AC | PRN

## 2023-07-29 MED ORDER — IBUPROFEN 800 MG PO TABS
800.0000 mg | ORAL_TABLET | Freq: Once | ORAL | Status: AC
  Administered 2023-07-29: 800 mg via ORAL
  Filled 2023-07-29: qty 1

## 2023-07-29 MED ORDER — LIDOCAINE 5 % EX PTCH
1.0000 | MEDICATED_PATCH | CUTANEOUS | Status: DC
  Administered 2023-07-29: 1 via TRANSDERMAL
  Filled 2023-07-29: qty 1

## 2023-07-29 NOTE — Discharge Instructions (Addendum)
It was a pleasure taking part in your care today.  As we discussed, your x-ray images were negative.  I am sending you home with muscle relaxers.  Please do not drive or operate heavy machinery while taking these.  Please take these twice a day as needed for pain.  You may also continue taking ibuprofen or Tylenol every 6 hours.  You may purchase Salonpas patches which are over-the-counter and apply these to your low back for pain relief.  Please follow-up with your PCP this week.  Please return to the ED with any new or worsening signs or symptoms such as bowel or bladder incontinence, weakness of your legs, groin numbness
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
## 2023-08-17 DIAGNOSIS — Z202 Contact with and (suspected) exposure to infections with a predominantly sexual mode of transmission: Secondary | ICD-10-CM | POA: Diagnosis not present

## 2023-09-04 DIAGNOSIS — M25532 Pain in left wrist: Secondary | ICD-10-CM | POA: Diagnosis not present

## 2023-09-12 DIAGNOSIS — M25532 Pain in left wrist: Secondary | ICD-10-CM | POA: Diagnosis not present

## 2023-09-19 DIAGNOSIS — M25532 Pain in left wrist: Secondary | ICD-10-CM | POA: Diagnosis not present

## 2023-09-26 DIAGNOSIS — M25532 Pain in left wrist: Secondary | ICD-10-CM | POA: Diagnosis not present

## 2023-10-04 DIAGNOSIS — M25532 Pain in left wrist: Secondary | ICD-10-CM | POA: Diagnosis not present

## 2023-10-11 DIAGNOSIS — M25532 Pain in left wrist: Secondary | ICD-10-CM | POA: Diagnosis not present

## 2023-10-18 DIAGNOSIS — M25532 Pain in left wrist: Secondary | ICD-10-CM | POA: Diagnosis not present

## 2024-01-18 NOTE — Telephone Encounter (Signed)
 Candidate reports that he has vision exam scheduled for 01/23/24.

## 2024-09-22 ENCOUNTER — Emergency Department (HOSPITAL_BASED_OUTPATIENT_CLINIC_OR_DEPARTMENT_OTHER)
Admission: EM | Admit: 2024-09-22 | Discharge: 2024-09-22 | Disposition: A | Discharge status: Home / Self Care | Attending: Emergency Medicine | Admitting: Emergency Medicine

## 2024-09-22 ENCOUNTER — Other Ambulatory Visit: Payer: Self-pay

## 2024-09-22 ENCOUNTER — Emergency Department (HOSPITAL_BASED_OUTPATIENT_CLINIC_OR_DEPARTMENT_OTHER)

## 2024-09-22 ENCOUNTER — Encounter (HOSPITAL_BASED_OUTPATIENT_CLINIC_OR_DEPARTMENT_OTHER): Payer: Self-pay

## 2024-09-22 DIAGNOSIS — M7989 Other specified soft tissue disorders: Secondary | ICD-10-CM | POA: Diagnosis not present

## 2024-09-22 DIAGNOSIS — L929 Granulomatous disorder of the skin and subcutaneous tissue, unspecified: Secondary | ICD-10-CM | POA: Insufficient documentation

## 2024-09-22 NOTE — ED Notes (Signed)
 Pt d/c instructions, medications, and follow-up care reviewed with pt. Pt verbalized understanding and had no further questions at time of d/c. Pt CA&Ox4, ambulatory, and in NAD at time of d/c

## 2024-09-22 NOTE — ED Provider Notes (Signed)
 [place] EMERGENCY DEPARTMENT AT [facility] Provider Note   CSN: [id number] Arrival date & time: 09/22/24  1028     Patient presents with: Wound Check   Bobby Love is a 53 y.o. male.   Patient presents to the emergency department today for evaluation of right middle finger wound.  Patient started having issues about 1 month ago.  He thought that he may have gotten a splinter at work.  He works in set designer.  He states that he tried to take it out but was not able to remove anything from the wound.  He has developed some soft tissue swelling between the MCP and PIP creases on the volar aspect of the middle finger.  This area was bleeding today, prompting emergency department visit.  States that he saw a publishing rights manager, was put on antibiotics which he has been taking for about 10 days.       Prior to Admission medications   Medication Sig Start Date End Date Taking? Authorizing Provider  cyclobenzaprine  (FLEXERIL ) 10 MG tablet Take 1 tablet (10 mg total) by mouth 2 (two) times daily as needed for muscle spasms. 07/29/23   Ruthell Lonni FALCON, PA-C  ibuprofen  (ADVIL ) 800 MG tablet Take 1 tablet (800 mg total) by mouth 3 (three) times daily. 06/29/23   Rising, Asberry, PA-C    Allergies: Patient has no known allergies.    Review of Systems  Updated Vital Signs BP (!) 155/99 (BP Location: Right Arm)   Pulse 61   Temp 98 F (36.7 C)   Resp 18   Ht 5' 7 (1.702 m)   Wt 83.9 kg   SpO2 96%   BMI 28.98 kg/m   Physical Exam Vitals and nursing note reviewed.  Constitutional:      Appearance: He is well-developed.  HENT:     Head: Normocephalic and atraumatic.  Eyes:     Conjunctiva/sclera: Conjunctivae normal.  Pulmonary:     Effort: No respiratory distress.  Musculoskeletal:     Cervical back: Normal range of motion and neck supple.     Comments: Patient can fully flex and extend the right middle finger.  Cap refill less than 2 seconds.  Skin:     General: Skin is warm and dry.     Comments: There is a small proximately 1 cm, circular friable granuloma noted between the MCP and PIP folds on the right middle finger, volar aspect.  Some dried blood noted, no active bleeding.  Neurological:     Mental Status: He is alert.     (all labs ordered are listed, but only abnormal results are displayed) Labs Reviewed - No data to display  EKG: None  Radiology: No results found.   Procedures   Medications Ordered in the ED - No data to display  ED Course  Patient seen and examined. History obtained directly from patient.   Labs/EKG: None ordered  Imaging: Ordered x-ray of the finger to evaluate for possibility of foreign body.  Medications/Fluids: None ordered  Most recent vital signs reviewed and are as follows: BP (!) 155/99 (BP Location: Right Arm)   Pulse 61   Temp 98 F (36.7 C)   Resp 18   Ht 5' 7 (1.702 m)   Wt 83.9 kg   SpO2 96%   BMI 28.98 kg/m   Initial impression: Granuloma, right middle finger volar aspect.  I did offer cauterization with silver nitrate stick.  Patient declines.  Discussed need for outpatient follow-up in  case he would need an excision performed.  Agrees to obtain x-ray today.  He will continue antibiotics prescribed previously.  //  Reassessment performed. Patient appears stable.  Band-Aid over the granuloma area.  Imaging personally visualized and interpreted including: X-ray without radiopaque foreign body  Reviewed pertinent lab work and imaging with patient at bedside. Questions answered.   Most current vital signs reviewed and are as follows: BP (!) 155/99 (BP Location: Right Arm)   Pulse 61   Temp 98 F (36.7 C)   Resp 18   Ht 5' 7 (1.702 m)   Wt 83.9 kg   SpO2 96%   BMI 28.98 kg/m   Plan: Discharge to home.   Prescriptions written for: None  Other home care instructions discussed: Continued wound care, keep the area clean, keep covered if possibility of  contamination.  ED return instructions discussed: New or worsening pain, redness or swelling.  Follow-up instructions discussed: Patient encouraged to follow-up with orthopedic hand surgery referral.                                 Medical Decision Making Amount and/or Complexity of Data Reviewed Radiology: ordered.   Patient with what appears to be a posttraumatic granuloma of his finger.  X-ray negative for retained foreign body.  No signs of cellulitis and patient is already taking antibiotics prescribed by another provider.  No indication for emergent orthopedic consultation.  He will need to follow-up as outpatient for additional management as it has become bothersome and interfering with work.     Final diagnoses:  Granuloma of skin    ED Discharge Orders     None          Desiderio Chew, PA-C 09/22/24 1453    Geroldine Berg, MD 09/24/24 [phone]

## 2024-09-22 NOTE — ED Triage Notes (Signed)
 Pt reports wound to R middle finger x1 month ago. Pt reports he thinks got a splinter in finger and it is not healing.

## 2024-09-22 NOTE — Discharge Instructions (Signed)
 Please read and follow all provided instructions.  Your diagnoses today include:  1. Granuloma of skin    Tests performed today include: An x-ray of the affected area - does NOT show any broken bones or foreign bodies Vital signs. See below for your results today.   Medications prescribed:  Please use over-the-counter NSAID medications (ibuprofen , naproxen) or Tylenol  (acetaminophen ) as directed on the packaging for pain -- as long as you do not have any reasons avoid these medications. Reasons to avoid NSAID medications include: weak kidneys, a history of bleeding in your stomach or gut, or uncontrolled high blood pressure or previous heart attack. Reasons to avoid Tylenol  include: liver problems or ongoing alcohol use. Never take more than 4000mg  or 8 Extra strength Tylenol  in a 24 hour period.     Take any prescribed medications only as directed.  Home care instructions:  Follow any educational materials contained in this packet Follow R.I.C.E. Protocol: R - rest your injury  I  - use ice on injury without applying directly to skin C - compress injury with bandage or splint E - elevate the injury as much as possible  Follow-up instructions: Please follow-up with the orthopedic hand surgeon listed for further evaluation of the growth on your finger which is likely a granuloma due to trauma.  This will need to be treated by a specialist given the sensitive nature of the area.  Return instructions:  Please return if your fingers are numb or tingling, appear gray or blue, or you have severe pain (also elevate the arm and loosen splint or wrap if you were given one) Please return to the Emergency Department if you experience worsening symptoms.  Please return if you have any other emergent concerns.  Additional Information:  Your vital signs today were: BP (!) 155/99 (BP Location: Right Arm)   Pulse 61   Temp 98 F (36.7 C)   Resp 18   Ht 5' 7 (1.702 m)   Wt 83.9 kg   SpO2 96%    BMI 28.98 kg/m  If your blood pressure (BP) was elevated above 135/85 this visit, please have this repeated by your doctor within one month. --------------

## 2024-09-26 DIAGNOSIS — L929 Granulomatous disorder of the skin and subcutaneous tissue, unspecified: Secondary | ICD-10-CM | POA: Diagnosis not present

## 2024-10-03 DIAGNOSIS — L929 Granulomatous disorder of the skin and subcutaneous tissue, unspecified: Secondary | ICD-10-CM | POA: Diagnosis not present

## 2024-10-11 DIAGNOSIS — Z800 Family history of malignant neoplasm of digestive organs: Secondary | ICD-10-CM | POA: Diagnosis not present

## 2024-10-11 DIAGNOSIS — Z09 Encounter for follow-up examination after completed treatment for conditions other than malignant neoplasm: Secondary | ICD-10-CM | POA: Diagnosis not present

## 2024-10-11 DIAGNOSIS — Z860101 Personal history of adenomatous and serrated colon polyps: Secondary | ICD-10-CM | POA: Diagnosis not present

## 2024-10-11 DIAGNOSIS — D122 Benign neoplasm of ascending colon: Secondary | ICD-10-CM | POA: Diagnosis not present
